# Patient Record
Sex: Female | Born: 2000 | Race: White | Hispanic: No | Marital: Single
Health system: Southern US, Community
[De-identification: ages and names within clinical notes are randomized; demographics above are authoritative.]

## PROBLEM LIST (undated history)

## (undated) DIAGNOSIS — H539 Unspecified visual disturbance: Secondary | ICD-10-CM

## (undated) DIAGNOSIS — Z789 Other specified health status: Secondary | ICD-10-CM

## (undated) DIAGNOSIS — R51 Headache: Secondary | ICD-10-CM

## (undated) DIAGNOSIS — F909 Attention-deficit hyperactivity disorder, unspecified type: Secondary | ICD-10-CM

## (undated) DIAGNOSIS — F419 Anxiety disorder, unspecified: Secondary | ICD-10-CM

## (undated) HISTORY — DX: Other specified health status: Z78.9

## (undated) HISTORY — PX: NO PAST SURGERIES: SHX2092

---
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
## 2020-02-01 ENCOUNTER — Emergency Department: Payer: Medicaid Other

## 2020-02-01 ENCOUNTER — Other Ambulatory Visit: Payer: Self-pay

## 2020-02-01 ENCOUNTER — Encounter: Payer: Self-pay | Admitting: Emergency Medicine

## 2020-02-01 ENCOUNTER — Emergency Department
Admission: EM | Admit: 2020-02-01 | Discharge: 2020-02-01 | Disposition: A | Discharge status: Home / Self Care | Payer: Medicaid Other | Attending: Emergency Medicine | Admitting: Emergency Medicine

## 2020-02-01 DIAGNOSIS — M542 Cervicalgia: Secondary | ICD-10-CM | POA: Diagnosis present

## 2020-02-01 DIAGNOSIS — Y92410 Unspecified street and highway as the place of occurrence of the external cause: Secondary | ICD-10-CM | POA: Diagnosis not present

## 2020-02-01 DIAGNOSIS — Y999 Unspecified external cause status: Secondary | ICD-10-CM | POA: Insufficient documentation

## 2020-02-01 DIAGNOSIS — Z7722 Contact with and (suspected) exposure to environmental tobacco smoke (acute) (chronic): Secondary | ICD-10-CM | POA: Insufficient documentation

## 2020-02-01 DIAGNOSIS — M545 Low back pain, unspecified: Secondary | ICD-10-CM

## 2020-02-01 DIAGNOSIS — Z041 Encounter for examination and observation following transport accident: Secondary | ICD-10-CM | POA: Insufficient documentation

## 2020-02-01 DIAGNOSIS — Y9389 Activity, other specified: Secondary | ICD-10-CM | POA: Diagnosis not present

## 2020-02-01 LAB — POCT PREGNANCY, URINE: Preg Test, Ur: NEGATIVE

## 2020-02-01 MED ORDER — TRAMADOL HCL 50 MG PO TABS: 50.0000 mg | ORAL_TABLET | Freq: Four times a day (QID) | ORAL | 0 refills | Status: AC | PRN

## 2020-02-01 MED ORDER — CYCLOBENZAPRINE HCL 5 MG PO TABS: 5.0000 mg | ORAL_TABLET | Freq: Three times a day (TID) | ORAL | 0 refills | Status: DC | PRN
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
## 2020-02-01 NOTE — Discharge Instructions (Addendum)
Please alternate Tylenol and ibuprofen at home.  You can take 500 mg of Tylenol every 4-6 hours.  You can take 600 mg of ibuprofen every 6-8 hours.  You were also prescribed tramadol 50 mg every 6 hours as needed for severe pain.  You were also prescribed Flexeril 5 mg that you can take up to 3 times daily as needed for muscle spasm.  Please note that the tramadol and Flexeril carry a risk of drowsiness.  Please do not drive a car or operate heavy machinery while using these medications.  Please follow-up with the [facility] clinic should you have any continuing pain or issues after 2 weeks.
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
## 2023-10-25 ENCOUNTER — Encounter: Admitting: Certified Nurse Midwife

## 2023-10-25 DIAGNOSIS — Z3046 Encounter for surveillance of implantable subdermal contraceptive: Secondary | ICD-10-CM

## 2023-10-26 ENCOUNTER — Encounter: Payer: Self-pay | Admitting: Certified Nurse Midwife

## 2024-06-19 ENCOUNTER — Other Ambulatory Visit: Payer: Self-pay

## 2024-06-19 ENCOUNTER — Ambulatory Visit: Admitting: *Deleted

## 2024-06-19 VITALS — BP 130/75 | HR 102 | Wt 150.0 lb

## 2024-06-19 DIAGNOSIS — Z3482 Encounter for supervision of other normal pregnancy, second trimester: Secondary | ICD-10-CM

## 2024-06-19 DIAGNOSIS — Z3400 Encounter for supervision of normal first pregnancy, unspecified trimester: Secondary | ICD-10-CM | POA: Insufficient documentation

## 2024-06-19 DIAGNOSIS — O0932 Supervision of pregnancy with insufficient antenatal care, second trimester: Secondary | ICD-10-CM

## 2024-06-19 DIAGNOSIS — Z3402 Encounter for supervision of normal first pregnancy, second trimester: Secondary | ICD-10-CM

## 2024-06-19 DIAGNOSIS — O3680X0 Pregnancy with inconclusive fetal viability, not applicable or unspecified: Secondary | ICD-10-CM

## 2024-06-19 DIAGNOSIS — Z3A17 17 weeks gestation of pregnancy: Secondary | ICD-10-CM

## 2024-06-19 NOTE — Progress Notes (Signed)
 New OB Intake  I explained I am completing New OB Intake today. We discussed EDD of 11/25/2024, by Last Menstrual Period. Pt is G1P0. I reviewed her allergies, medications and Medical/Surgical/OB history.    Patient Active Problem List   Diagnosis Date Noted   Encounter for supervision of normal first pregnancy 06/19/2024   MDD (major depressive disorder), recurrent episode, severe (HCC) 04/03/2013   Post traumatic stress disorder (PTSD) 04/03/2013   ADHD (attention deficit hyperactivity disorder), predominantly hyperactive impulsive type 04/03/2013    Concerns addressed today  Patient informed that the ultrasound is considered a limited obstetric ultrasound and is not intended to be a complete ultrasound exam.  Patient also informed that the ultrasound is not being completed with the intent of assessing for fetal or placental anomalies or any pelvic abnormalities. Explained that the purpose of today's ultrasound is to assess for viability.  Patient acknowledges the purpose of the exam and the limitations of the study.     Delivery Plans Plans to deliver at [facility]. Discussed the nature of our practice with multiple providers including residents and students. Due to the size of the practice, the delivering provider may not be the same as those providing prenatal care.   MyChart/Babyscripts MyChart access verified. I explained pt will have some visits in office and some virtually. Babyscripts app discussed and ordered.   Blood Pressure Cuff Blood pressure cuff discussed and given.Discussed to be used for virtual visits and or if needed BP checks weekly.  Anatomy US  Explained first scheduled US  will be around 19 weeks.   Last Pap 2025-June-WNL  First visit review I reviewed new OB appt with patient. Explained pt will be seen by Dr Izell at first visit. Discussed Jennell genetic screening with patient collected today. Routine prenatal labs collected today.    Wanda Buckles,  RN 06/19/2024  2:22 PM

## 2024-06-20 LAB — CBC/D/PLT+RPR+RH+ABO+RUBIGG...
Antibody Screen: NEGATIVE
Basophils Absolute: 0 x10E3/uL (ref 0.0–0.2)
Basos: 0 %
EOS (ABSOLUTE): 0.1 x10E3/uL (ref 0.0–0.4)
Eos: 1 %
HCV Ab: NONREACTIVE
HIV Screen 4th Generation wRfx: NONREACTIVE
Hematocrit: 37.7 % (ref 34.0–46.6)
Hemoglobin: 12.5 g/dL (ref 11.1–15.9)
Hepatitis B Surface Ag: NEGATIVE
Immature Grans (Abs): 0.1 x10E3/uL (ref 0.0–0.1)
Immature Granulocytes: 1 %
Lymphocytes Absolute: 1.6 x10E3/uL (ref 0.7–3.1)
Lymphs: 17 %
MCH: 31.9 pg (ref 26.6–33.0)
MCHC: 33.2 g/dL (ref 31.5–35.7)
MCV: 96 fL (ref 79–97)
Monocytes Absolute: 0.5 x10E3/uL (ref 0.1–0.9)
Monocytes: 5 %
Neutrophils Absolute: 7.2 x10E3/uL — ABNORMAL HIGH (ref 1.4–7.0)
Neutrophils: 76 %
Platelets: 249 x10E3/uL (ref 150–450)
RBC: 3.92 x10E6/uL (ref 3.77–5.28)
RDW: 12.7 % (ref 11.7–15.4)
RPR Ser Ql: NONREACTIVE
Rh Factor: NEGATIVE
Rubella Antibodies, IGG: <0.9 {index} — ABNORMAL LOW
WBC: 9.5 x10E3/uL (ref 3.4–10.8)

## 2024-06-20 LAB — HEMOGLOBIN A1C
Est. average glucose Bld gHb Est-mCnc: 94 mg/dL
Hgb A1c MFr Bld: 4.9 % (ref 4.8–5.6)

## 2024-06-20 LAB — HCV INTERPRETATION

## 2024-06-23 LAB — URINE CULTURE, OB REFLEX

## 2024-06-23 LAB — CULTURE, OB URINE

## 2024-06-24 ENCOUNTER — Encounter: Payer: Self-pay | Admitting: Obstetrics and Gynecology

## 2024-06-24 DIAGNOSIS — O26899 Other specified pregnancy related conditions, unspecified trimester: Secondary | ICD-10-CM | POA: Insufficient documentation

## 2024-06-24 LAB — CERVICOVAGINAL ANCILLARY ONLY
Chlamydia: NEGATIVE
Comment: NEGATIVE
Comment: NORMAL
Neisseria Gonorrhea: NEGATIVE

## 2024-06-26 LAB — PANORAMA PRENATAL TEST FULL PANEL:PANORAMA TEST PLUS 5 ADDITIONAL MICRODELETIONS: FETAL FRACTION: 7.5

## 2024-06-27 ENCOUNTER — Ambulatory Visit (INDEPENDENT_AMBULATORY_CARE_PROVIDER_SITE_OTHER): Admitting: Obstetrics and Gynecology

## 2024-06-27 ENCOUNTER — Encounter: Payer: Self-pay | Admitting: Obstetrics and Gynecology

## 2024-06-27 VITALS — BP 118/71 | HR 78 | Wt 153.0 lb

## 2024-06-27 DIAGNOSIS — Z6791 Unspecified blood type, Rh negative: Secondary | ICD-10-CM | POA: Diagnosis not present

## 2024-06-27 DIAGNOSIS — Z2839 Other underimmunization status: Secondary | ICD-10-CM | POA: Insufficient documentation

## 2024-06-27 DIAGNOSIS — Z3A18 18 weeks gestation of pregnancy: Secondary | ICD-10-CM

## 2024-06-27 DIAGNOSIS — O26892 Other specified pregnancy related conditions, second trimester: Secondary | ICD-10-CM

## 2024-06-27 DIAGNOSIS — O0932 Supervision of pregnancy with insufficient antenatal care, second trimester: Secondary | ICD-10-CM | POA: Diagnosis not present

## 2024-06-27 DIAGNOSIS — O0930 Supervision of pregnancy with insufficient antenatal care, unspecified trimester: Secondary | ICD-10-CM | POA: Insufficient documentation

## 2024-06-27 LAB — HORIZON CUSTOM: REPORT SUMMARY: NEGATIVE

## 2024-06-27 NOTE — Progress Notes (Signed)
 New OB Note  06/27/2024   Clinic: [facility]  Chief Complaint: new OB  Transfer of Care Patient: no  History of Present Illness: Alison Cannon is a 24 y.o. G1P0 at 18/3 weeks (EDC 6/8, based on Patient's last menstrual period was 02/19/2024 (exact date).=17wk femur length)  Pregnancy complicated by has MDD (major depressive disorder), recurrent episode, severe (HCC); Post traumatic stress disorder (PTSD); ADHD (attention deficit hyperactivity disorder), predominantly hyperactive impulsive type; Encounter for supervision of normal first pregnancy; Rh negative state in antepartum period; Rubella non-immune status, antepartum; and Late prenatal care on their problem list.   Patient denies any questions, complaints or issues today  ROS: A 12-point review of systems was performed and negative, except as stated in the above HPI.  OBGYN History: As per HPI. OB History  Gravida Para Term Preterm AB Living  1       SAB IAB Ectopic Multiple Live Births          # Outcome Date GA Lbr Len/2nd Weight Sex Type Anes PTL Lv  1 Current              Past Medical History: Past Medical History:  Diagnosis Date   ADHD (attention deficit hyperactivity disorder)    Anxiety    Headache(784.0)    Medical history non-contributory    Vision abnormalities    Past Surgical History: Past Surgical History:  Procedure Laterality Date   NO PAST SURGERIES     Family History:  Family History  Problem Relation Age of Onset   Bipolar disorder Mother    Drug abuse Mother    Social History:  Social History   Socioeconomic History   Marital status: Single    Spouse name: Not on file   Number of children: Not on file   Years of education: Not on file   Highest education level: Not on file  Occupational History   Occupation: Consulting Civil Engineer    Comment: [employer]  Tobacco Use   Smoking status: Passive Smoke Exposure - Never Smoker   Smokeless tobacco: Never  Substance  and Sexual Activity   Alcohol use: No   Drug use: No   Sexual activity: Not Currently  Other Topics Concern   Not on file  Social History Narrative   Not on file   Social Drivers of Health   Tobacco Use: Medium Risk (06/27/2024)   Patient History    Smoking Tobacco Use: Passive Smoke Exposure - Never Smoker    Smokeless Tobacco Use: Never    Passive Exposure: Yes  Financial Resource Strain: Not on file  Food Insecurity: Not on file  Transportation Needs: Not on file  Physical Activity: Not on file  Stress: Not on file  Social Connections: Not on file  Intimate Partner Violence: Not on file  Depression (PHQ2-9): Low Risk (06/27/2024)   Depression (PHQ2-9)    PHQ-2 Score: 3  Alcohol Screen: Not on file  Housing: Not on file  Utilities: Not on file  Health Literacy: Not on file   Allergy: Allergies[1]  Current Outpatient Medications: Prenatal vitamin  Physical Exam:   BP 118/71   Pulse 78   Wt 153 lb (69.4 kg)   LMP 02/19/2024 (Exact Date)   BMI 23.26 kg/m  Body mass index is 23.26 kg/m. Contractions: Not present Vag. Bleeding: None. Fundal height: not applicable FHTs: 140s  General appearance: Well nourished, well developed female in no acute distress.  Neck:  Supple, normal appearance, and no  thyromegaly  Cardiovascular: S1, S2 normal, no murmur, rub or gallop, regular rate and rhythm Respiratory:  Clear to auscultation bilateral. Normal respiratory effort Abdomen: positive bowel sounds and no masses, hernias; diffusely non tender to palpation, non distended Breasts: no complaints. Neuro/Psych:  Normal mood and affect.  Skin:  Warm and dry.   Laboratory: reviewed  Imaging:  As per HPI  Assessment: patient doing well  Plan: 1. Rubella non-immune status, antepartum (Primary) Offer MMR PP  2. [redacted] weeks gestation of pregnancy Follow up anatomy u/s, confirm dates  3. Late prenatal care  4. Rh negative state in antepartum period Rhogam at 28wk and  PP.   Problem list reviewed and updated.  Follow up in 4 weeks.   >50% of 30 min visit spent on counseling and coordination of care.  Return in about 1 month (around 07/28/2024) for in person, md or app, low risk ob. Future Appointments  Date Time Provider Department Center  07/25/2024  2:30 PM Fredirick Glenys RAMAN, MD [facility]  07/26/2024  1:00 PM [facility] PROVIDER 1 [facility]  07/26/2024  1:30 PM [facility] US1 [facility]   Bebe Izell Overcast MD Attending [facility] (Faculty Practice)     [1]  Allergies Allergen Reactions   Wound Dressing Adhesive Dermatitis

## 2024-07-05 ENCOUNTER — Encounter: Payer: Self-pay | Admitting: Obstetrics and Gynecology

## 2024-07-09 ENCOUNTER — Encounter: Payer: Self-pay | Admitting: Obstetrics and Gynecology

## 2024-07-25 ENCOUNTER — Ambulatory Visit: Admitting: Family Medicine

## 2024-07-25 VITALS — BP 128/70 | HR 90 | Wt 157.0 lb

## 2024-07-25 DIAGNOSIS — O26899 Other specified pregnancy related conditions, unspecified trimester: Secondary | ICD-10-CM

## 2024-07-25 DIAGNOSIS — Z2839 Other underimmunization status: Secondary | ICD-10-CM

## 2024-07-25 DIAGNOSIS — O26892 Other specified pregnancy related conditions, second trimester: Secondary | ICD-10-CM | POA: Diagnosis not present

## 2024-07-25 DIAGNOSIS — Z6791 Unspecified blood type, Rh negative: Secondary | ICD-10-CM

## 2024-07-25 DIAGNOSIS — Z23 Encounter for immunization: Secondary | ICD-10-CM | POA: Diagnosis not present

## 2024-07-25 DIAGNOSIS — Z3A22 22 weeks gestation of pregnancy: Secondary | ICD-10-CM

## 2024-07-25 DIAGNOSIS — Z3402 Encounter for supervision of normal first pregnancy, second trimester: Secondary | ICD-10-CM

## 2024-07-25 NOTE — Progress Notes (Signed)
" ° °  PRENATAL VISIT NOTE  Subjective:  Alison Cannon is a 24 y.o. G1P0 at [redacted]w[redacted]d being seen today for ongoing prenatal care.  She is currently monitored for the following issues for this low-risk pregnancy and has MDD (major depressive disorder), recurrent episode, severe (HCC); Post traumatic stress disorder (PTSD); ADHD (attention deficit hyperactivity disorder), predominantly hyperactive impulsive type; Encounter for supervision of normal first pregnancy; Rh negative state in antepartum period; Rubella non-immune status, antepartum; and Late prenatal care on their problem list.  Patient reports no complaints.  Contractions: Not present. Vag. Bleeding: None.  Movement: Present. Denies leaking of fluid.   The following portions of the patient's history were reviewed and updated as appropriate: allergies, current medications, past family history, past medical history, past social history, past surgical history and problem list.   Objective:   Vitals:   07/25/24 1444  BP: 128/70  Pulse: 90  Weight: 157 lb (71.2 kg)    Fetal Status:      Movement: Present    General: Alert, oriented and cooperative. Patient is in no acute distress.  Skin: Skin is warm and dry. No rash noted.   Cardiovascular: Normal heart rate noted  Respiratory: Normal respiratory effort, no problems with respiration noted  Abdomen: Soft, gravid, appropriate for gestational age.  Pain/Pressure: Absent     Pelvic: Cervical exam deferred        Extremities: Normal range of motion.     Mental Status: Normal mood and affect. Normal behavior. Normal judgment and thought content.      06/27/2024   10:00 AM  Depression screen PHQ 2/9  Decreased Interest 0  Down, Depressed, Hopeless 0  PHQ - 2 Score 0  Altered sleeping 1  Tired, decreased energy 1  Change in appetite 0  Feeling bad or failure about yourself  0  Trouble concentrating 1  Moving slowly or fidgety/restless 0  Suicidal thoughts 0  PHQ-9 Score 3         06/27/2024   10:02 AM  GAD 7 : Generalized Anxiety Score  Nervous, Anxious, on Edge 0   Control/stop worrying 0   Worry too much - different things 0   Trouble relaxing 0   Restless 0   Easily annoyed or irritable 1   Afraid - awful might happen 1   Total GAD 7 Score 2     Data saved with a previous flowsheet row definition    Assessment and Plan:  Pregnancy: G1P0 at [redacted]w[redacted]d 1. Rubella non-immune status, antepartum (Primary) MMR pp  2. Rh negative state in antepartum period Rhogam @ 28 weeks and pp if indicated  3. Encounter for supervision of normal first pregnancy in second trimester - Flu vaccine trivalent PF, 6mos and older(Flulaval,Afluria,Fluarix,Fluzone)  4. [redacted] weeks gestation of pregnancy - Flu vaccine trivalent PF, 6mos and older(Flulaval,Afluria,Fluarix,Fluzone)  Preterm labor symptoms and general obstetric precautions including but not limited to vaginal bleeding, contractions, leaking of fluid and fetal movement were reviewed in detail with the patient. Please refer to After Visit Summary for other counseling recommendations.   Return in 4 weeks (on 08/22/2024).  Future Appointments  Date Time Provider Department Center  07/26/2024  1:00 PM [facility] PROVIDER 1 [facility]  07/26/2024  1:30 PM [facility] US1 [facility]  08/20/2024  2:30 PM Anyanwu, Gloris LABOR, MD [facility]    Glenys GORMAN Birk, MD  "

## 2024-07-26 ENCOUNTER — Other Ambulatory Visit

## 2024-07-26 ENCOUNTER — Ambulatory Visit: Admitting: Obstetrics and Gynecology

## 2024-07-26 VITALS — BP 118/66 | HR 67

## 2024-07-26 DIAGNOSIS — Z3402 Encounter for supervision of normal first pregnancy, second trimester: Secondary | ICD-10-CM

## 2024-07-26 DIAGNOSIS — Z3A22 22 weeks gestation of pregnancy: Secondary | ICD-10-CM

## 2024-07-26 DIAGNOSIS — O0930 Supervision of pregnancy with insufficient antenatal care, unspecified trimester: Secondary | ICD-10-CM

## 2024-07-26 NOTE — Progress Notes (Unsigned)
 After review, MFM consult with provider is not indicated for today  Arna Ranks, MD 07/26/2024 2:44 PM  [facility]

## 2024-07-26 NOTE — Progress Notes (Signed)
 After review, MFM consult with provider is not indicated for today  Arna Ranks, MD 07/26/2024 2:44 PM  [facility]

## 2024-08-20 ENCOUNTER — Encounter: Admitting: Obstetrics & Gynecology
